# Patient Record
Sex: Male | Born: 1970 | Race: White | Hispanic: No | Marital: Married | Smoking: Current every day smoker
Health system: Southern US, Community
[De-identification: ages and names within clinical notes are randomized; demographics above are authoritative.]

## PROBLEM LIST (undated history)

## (undated) DIAGNOSIS — M549 Dorsalgia, unspecified: Secondary | ICD-10-CM

## (undated) DIAGNOSIS — I10 Essential (primary) hypertension: Secondary | ICD-10-CM

## (undated) HISTORY — PX: KNEE SURGERY: SHX244

---

## 2010-03-15 ENCOUNTER — Emergency Department (HOSPITAL_BASED_OUTPATIENT_CLINIC_OR_DEPARTMENT_OTHER): Admission: EM | Admit: 2010-03-15 | Discharge: 2010-03-15 | Payer: Self-pay | Admitting: Emergency Medicine

## 2010-06-21 ENCOUNTER — Ambulatory Visit: Payer: Self-pay | Admitting: Interventional Radiology

## 2010-06-21 ENCOUNTER — Emergency Department (HOSPITAL_BASED_OUTPATIENT_CLINIC_OR_DEPARTMENT_OTHER): Admission: EM | Admit: 2010-06-21 | Discharge: 2010-06-21 | Payer: Self-pay | Admitting: Emergency Medicine

## 2010-07-05 ENCOUNTER — Ambulatory Visit: Payer: Self-pay | Admitting: Diagnostic Radiology

## 2010-07-05 ENCOUNTER — Ambulatory Visit (HOSPITAL_BASED_OUTPATIENT_CLINIC_OR_DEPARTMENT_OTHER): Admission: RE | Admit: 2010-07-05 | Discharge: 2010-07-05 | Payer: Self-pay | Admitting: Orthopedic Surgery

## 2010-07-06 ENCOUNTER — Ambulatory Visit: Payer: Self-pay | Admitting: Family Medicine

## 2010-07-06 DIAGNOSIS — M545 Low back pain: Secondary | ICD-10-CM

## 2010-07-06 DIAGNOSIS — M542 Cervicalgia: Secondary | ICD-10-CM

## 2010-07-07 ENCOUNTER — Emergency Department (HOSPITAL_BASED_OUTPATIENT_CLINIC_OR_DEPARTMENT_OTHER): Admission: EM | Admit: 2010-07-07 | Discharge: 2010-07-07 | Payer: Self-pay | Admitting: Emergency Medicine
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
## 2010-11-29 LAB — COMPREHENSIVE METABOLIC PANEL
AST: 21 U/L (ref 0–37)
CO2: 28 mEq/L (ref 19–32)
Chloride: 105 mEq/L (ref 96–112)
Creatinine, Ser: 0.9 mg/dL (ref 0.4–1.5)
GFR calc Af Amer: >60 mL/min (ref >60)
GFR calc non Af Amer: >60 mL/min (ref >60)
Glucose, Bld: 110 mg/dL — ABNORMAL HIGH (ref 70–99)
Total Bilirubin: 0.6 mg/dL (ref 0.3–1.2)

## 2010-11-29 LAB — CBC
HCT: 44.9 % (ref 39.0–52.0)
Hemoglobin: 15 g/dL (ref 13.0–17.0)
MCH: 32.7 pg (ref 26.0–34.0)
MCV: 98 fL (ref 78.0–100.0)
RBC: 4.58 MIL/uL (ref 4.22–5.81)

## 2010-11-29 LAB — DIFFERENTIAL
Basophils Absolute: 0.1 10*3/uL (ref 0.0–0.1)
Eosinophils Absolute: 0.2 10*3/uL (ref 0.0–0.7)
Eosinophils Relative: 2 % (ref 0–5)
Lymphocytes Relative: 21 % (ref 12–46)
Neutrophils Relative %: 68 % (ref 43–77)

## 2010-11-29 LAB — PROTIME-INR: Prothrombin Time: 12.1 seconds (ref 11.6–15.2)

## 2015-10-26 ENCOUNTER — Emergency Department (HOSPITAL_BASED_OUTPATIENT_CLINIC_OR_DEPARTMENT_OTHER)
Admission: EM | Admit: 2015-10-26 | Discharge: 2015-10-26 | Disposition: A | Discharge status: Home / Self Care | Payer: PRIVATE HEALTH INSURANCE | Attending: Emergency Medicine | Admitting: Emergency Medicine

## 2015-10-26 ENCOUNTER — Emergency Department (HOSPITAL_BASED_OUTPATIENT_CLINIC_OR_DEPARTMENT_OTHER): Payer: PRIVATE HEALTH INSURANCE

## 2015-10-26 ENCOUNTER — Encounter (HOSPITAL_BASED_OUTPATIENT_CLINIC_OR_DEPARTMENT_OTHER): Payer: Self-pay | Admitting: Emergency Medicine

## 2015-10-26 DIAGNOSIS — Y998 Other external cause status: Secondary | ICD-10-CM | POA: Diagnosis not present

## 2015-10-26 DIAGNOSIS — S20212A Contusion of left front wall of thorax, initial encounter: Secondary | ICD-10-CM | POA: Insufficient documentation

## 2015-10-26 DIAGNOSIS — Y9289 Other specified places as the place of occurrence of the external cause: Secondary | ICD-10-CM | POA: Insufficient documentation

## 2015-10-26 DIAGNOSIS — S29001A Unspecified injury of muscle and tendon of front wall of thorax, initial encounter: Secondary | ICD-10-CM | POA: Diagnosis present

## 2015-10-26 DIAGNOSIS — Y9389 Activity, other specified: Secondary | ICD-10-CM | POA: Insufficient documentation

## 2015-10-26 DIAGNOSIS — F17200 Nicotine dependence, unspecified, uncomplicated: Secondary | ICD-10-CM | POA: Diagnosis not present

## 2015-10-26 DIAGNOSIS — S79912A Unspecified injury of left hip, initial encounter: Secondary | ICD-10-CM | POA: Diagnosis not present

## 2015-10-26 DIAGNOSIS — S50319A Abrasion of unspecified elbow, initial encounter: Secondary | ICD-10-CM | POA: Diagnosis not present

## 2015-10-26 DIAGNOSIS — W11XXXA Fall on and from ladder, initial encounter: Secondary | ICD-10-CM | POA: Insufficient documentation

## 2015-10-26 DIAGNOSIS — T07XXXA Unspecified multiple injuries, initial encounter: Secondary | ICD-10-CM

## 2015-10-26 MED ORDER — METHOCARBAMOL 500 MG PO TABS: 500.0000 mg | ORAL_TABLET | Freq: Two times a day (BID) | ORAL | Status: DC

## 2015-10-26 MED ORDER — NAPROXEN 500 MG PO TABS: 500.0000 mg | ORAL_TABLET | Freq: Two times a day (BID) | ORAL | Status: DC

## 2015-10-26 MED ORDER — HYDROCODONE-ACETAMINOPHEN 5-325 MG PO TABS: 2.0000 | ORAL_TABLET | ORAL | Status: DC | PRN

## 2015-10-26 MED FILL — METHOCARBAMOL 500 MG TABLET: 500 | 10 days supply | Qty: 20 | Fill #0

## 2015-10-26 MED FILL — HYDROCODON-APAP 5-325: 5-325 | 2 days supply | Qty: 20 | Fill #0

## 2015-10-26 NOTE — ED Notes (Signed)
Patient returns from X ray.

## 2015-10-26 NOTE — ED Provider Notes (Signed)
CSN: [id number]     Arrival date & time 10/26/15  0907 History   First MD Initiated Contact with Patient 10/26/15 [phone]     Chief Complaint  Patient presents with  . Fall      HPI  Vision presents for evaluation after a fall from a ladder yesterday. He states he was up on a approximate 10 foot ladder. The bottom of the ladder went out. He fell directly the ground. States he did fall against part of the ladder. Has abrasion to his elbow. Pain left ribs and left hip. Didn't strike his head. No midline neck or back pain. No loss conscious. No numbness weakness tingling extremity. Walk because of the hip pain. No cough or hemoptysis. Is not short of breath. Painful with left ribs with breathing and cough  No past medical history on file. Past Surgical History  Procedure Laterality Date  . Knee surgery     No family history on file. Social History  Substance Use Topics  . Smoking status: Current Every Day Smoker  . Smokeless tobacco: None  . Alcohol Use: No    Review of Systems  Constitutional: Negative for fever, chills, diaphoresis, appetite change and fatigue.  HENT: Negative for mouth sores, sore throat and trouble swallowing.   Eyes: Negative for visual disturbance.  Respiratory: Negative for cough, chest tightness, shortness of breath and wheezing.   Cardiovascular: Positive for chest pain.  Gastrointestinal: Negative for nausea, vomiting, abdominal pain, diarrhea and abdominal distention.  Endocrine: Negative for polydipsia, polyphagia and polyuria.  Genitourinary: Negative for dysuria, frequency and hematuria.  Musculoskeletal: Positive for myalgias and arthralgias. Negative for gait problem.  Skin: Negative for color change, pallor and rash.  Neurological: Negative for dizziness, syncope, light-headedness and headaches.  Hematological: Does not bruise/bleed easily.  Psychiatric/Behavioral: Negative for behavioral problems and confusion.      Allergies  Review of  patient's allergies indicates no known allergies.  Home Medications   Prior to Admission medications   Medication Sig Start Date End Date Taking? Authorizing Provider  acetaminophen (TYLENOL) 325 MG tablet Take 650 mg by mouth every 6 (six) hours as needed.   Yes Historical Provider, MD  ibuprofen (ADVIL,MOTRIN) 200 MG tablet Take 200 mg by mouth every 6 (six) hours as needed.   Yes Historical Provider, MD  HYDROcodone-acetaminophen (NORCO/VICODIN) 5-325 MG tablet Take 2 tablets by mouth every 4 (four) hours as needed. 10/26/15   Rolland Porter, MD  methocarbamol (ROBAXIN) 500 MG tablet Take 1 tablet (500 mg total) by mouth 2 (two) times daily. 10/26/15   Rolland Porter, MD  naproxen (NAPROSYN) 500 MG tablet Take 1 tablet (500 mg total) by mouth 2 (two) times daily. 10/26/15   Rolland Porter, MD   BP 185/108 mmHg  Pulse 104  Temp(Src) 98.2 F (36.8 C) (Oral)  Resp 18  Ht 6' (1.829 m)  Wt 150 lb (68.04 kg)  BMI 20.34 kg/m2  SpO2 97% Physical Exam  Constitutional: He is oriented to person, place, and time. He appears well-developed and well-nourished. No distress.  HENT:  Head: Normocephalic.  Eyes: Conjunctivae are normal. Pupils are equal, round, and reactive to light. No scleral icterus.  Neck: Normal range of motion. Neck supple. No thyromegaly present.  Cardiovascular: Normal rate and regular rhythm.  Exam reveals no gallop and no friction rub.   No murmur heard. Pulmonary/Chest: Effort normal and breath sounds normal. No respiratory distress. He has no wheezes. He has no rales.      Abdominal:  Soft. Bowel sounds are normal. He exhibits no distension. There is no tenderness. There is no rebound.  Musculoskeletal: Normal range of motion.       Legs: Neurological: He is alert and oriented to person, place, and time.  Skin: Skin is warm and dry. No rash noted.  Psychiatric: He has a normal mood and affect. His behavior is normal.    ED Course  Procedures (including critical care time) Labs  Review Labs Reviewed - No data to display  Imaging Review Dg Ribs Unilateral W/chest Left  10/26/2015  CLINICAL DATA:  Left lower rib pain after falling 10 feet from a ladder yesterday. EXAM: LEFT RIBS AND CHEST - 3+ VIEW COMPARISON:  None. FINDINGS: No acute left-sided rib fractures are seen. The heart size and mediastinal contours are normal. The lungs are clear aside from a small calcified left upper lobe granuloma. There is no evidence of pleural effusion or pneumothorax. Moderate thoracolumbar scoliosis noted. IMPRESSION: No evidence of acute left-sided rib fracture, pleural effusion or pneumothorax. Electronically Signed   By: Carey Bullocks M.D.   On: 10/26/2015 10:58   Dg Hip Unilat With Pelvis 2-3 Views Left  10/26/2015  CLINICAL DATA:  Pain following fall from ladder EXAM: DG HIP (WITH OR WITHOUT PELVIS) 2-3V LEFT COMPARISON:  None. FINDINGS: Frontal pelvis as well as frontal and lateral left hip images were obtained. There is no demonstrable fracture or dislocation. Hip joints and sacroiliac joints appear symmetric and unremarkable. There is degenerative change in the visualized lower lumbar spine. No erosive change. IMPRESSION: Degenerative change in visualized lower lumbar spine. No acute fracture or dislocation. The hip joints and sacroiliac joints appear symmetric bilaterally. Electronically Signed   By: Bretta Bang III M.D.   On: 10/26/2015 10:57   I have personally reviewed and evaluated these images and lab results as part of my medical decision-making.   EKG Interpretation None      MDM   Final diagnoses:  Multiple contusions  Chest wall contusion, left, initial encounter    Plan x-rays. We'll reevaluate. Pain control. Patient is driving. We'll have to prescribe the patient his medications after discharge.  11:26:  X-rays. Patient appropriate for discharge home. He is up and dressed. Plan is home. Vicodin, Robaxin, Motrin. Pulmonary toilet, cough and deep  breathing.    Rolland Porter, MD 10/26/15 1126

## 2015-10-26 NOTE — ED Notes (Signed)
Pt states he fell off a ladder yesterday, 10 foot ladder onto floor.  Pt states he is having pain to left side of body.

## 2015-10-26 NOTE — ED Notes (Signed)
Patient transported to X-ray 

## 2015-10-26 NOTE — Discharge Instructions (Signed)
Chest Contusion °A contusion is a deep bruise. Bruises happen when an injury causes bleeding under the skin. Signs of bruising include pain, puffiness (swelling), and discolored skin. The bruise may turn blue, purple, or yellow.  °HOME CARE °· Put ice on the injured area. °¨ Put ice in a plastic bag. °¨ Place a towel between the skin and the bag. °¨ Leave the ice on for 15-20 minutes at a time, 03-04 times a day for the first 48 hours. °· Only take medicine as told by your doctor. °· Rest. °· Take deep breaths (deep-breathing exercises) as told by your doctor. °· Stop smoking if you smoke. °· Do not lift objects over 5 pounds (2.3 kilograms) for 3 days or longer if told by your doctor. °GET HELP RIGHT AWAY IF:  °· You have more bruising or puffiness. °· You have pain that gets worse. °· You have trouble breathing. °· You are dizzy, weak, or pass out (faint). °· You have blood in your pee (urine) or poop (stool). °· You cough up or throw up (vomit) blood. °· Your puffiness or pain is not helped with medicines. °MAKE SURE YOU:  °· Understand these instructions. °· Will watch your condition. °· Will get help right away if you are not doing well or get worse. °  °This information is not intended to replace advice given to you by your health care provider. Make sure you discuss any questions you have with your health care provider. °  °Document Released: 02/20/2008 Document Revised: 05/28/2012 Document Reviewed: 02/25/2012 °Elsevier Interactive Patient Education ©2016 [employer] ° °Contusion °A contusion is a deep bruise. Contusions happen when an injury causes bleeding under the skin. Symptoms of bruising include pain, swelling, and discolored skin. The skin may turn blue, purple, or yellow. °HOME CARE  °· Rest the injured area. °· If told, put ice on the injured area. °¨ Put ice in a plastic bag. °¨ Place a towel between your skin and the bag. °¨ Leave the ice on for 20 minutes, 2-3 times per day. °· If told, put  light pressure (compression) on the injured area using an elastic bandage. Make sure the bandage is not too tight. Remove it and put it back on as told by your doctor. °· If possible, raise (elevate) the injured area above the level of your heart while you are sitting or lying down. °· Take over-the-counter and prescription medicines only as told by your doctor. °GET HELP IF: °· Your symptoms do not get better after several days of treatment. °· Your symptoms get worse. °· You have trouble moving the injured area. °GET HELP RIGHT AWAY IF:  °· You have very bad pain. °· You have a loss of feeling (numbness) in a hand or foot. °· Your hand or foot turns pale or cold. °  °This information is not intended to replace advice given to you by your health care provider. Make sure you discuss any questions you have with your health care provider. °  °Document Released: 02/20/2008 Document Revised: 05/25/2015 Document Reviewed: 01/19/2015 °Elsevier Interactive Patient Education ©2016 [employer] ° °

## 2016-09-03 ENCOUNTER — Encounter (HOSPITAL_BASED_OUTPATIENT_CLINIC_OR_DEPARTMENT_OTHER): Payer: Self-pay | Admitting: Emergency Medicine

## 2016-09-03 ENCOUNTER — Emergency Department (HOSPITAL_BASED_OUTPATIENT_CLINIC_OR_DEPARTMENT_OTHER)
Admission: EM | Admit: 2016-09-03 | Discharge: 2016-09-03 | Disposition: A | Discharge status: Home / Self Care | Payer: PRIVATE HEALTH INSURANCE | Attending: Emergency Medicine | Admitting: Emergency Medicine

## 2016-09-03 ENCOUNTER — Emergency Department (HOSPITAL_BASED_OUTPATIENT_CLINIC_OR_DEPARTMENT_OTHER): Payer: PRIVATE HEALTH INSURANCE

## 2016-09-03 DIAGNOSIS — Z79899 Other long term (current) drug therapy: Secondary | ICD-10-CM | POA: Insufficient documentation

## 2016-09-03 DIAGNOSIS — I10 Essential (primary) hypertension: Secondary | ICD-10-CM | POA: Insufficient documentation

## 2016-09-03 DIAGNOSIS — F17200 Nicotine dependence, unspecified, uncomplicated: Secondary | ICD-10-CM | POA: Diagnosis not present

## 2016-09-03 DIAGNOSIS — M545 Low back pain: Secondary | ICD-10-CM | POA: Diagnosis present

## 2016-09-03 DIAGNOSIS — M5441 Lumbago with sciatica, right side: Secondary | ICD-10-CM | POA: Insufficient documentation

## 2016-09-03 DIAGNOSIS — G8929 Other chronic pain: Secondary | ICD-10-CM | POA: Insufficient documentation

## 2016-09-03 DIAGNOSIS — M5442 Lumbago with sciatica, left side: Secondary | ICD-10-CM | POA: Diagnosis not present

## 2016-09-03 HISTORY — DX: Essential (primary) hypertension: I10

## 2016-09-03 LAB — CBC WITH DIFFERENTIAL/PLATELET
Basophils Absolute: 0 10*3/uL (ref 0.0–0.1)
Basophils Relative: 0 %
Eosinophils Absolute: 0.2 10*3/uL (ref 0.0–0.7)
Eosinophils Relative: 2 %
HCT: 39.7 % (ref 39.0–52.0)
Hemoglobin: 13.2 g/dL (ref 13.0–17.0)
Lymphocytes Relative: 15 %
Lymphs Abs: 1.4 10*3/uL (ref 0.7–4.0)
MCH: 32.2 pg (ref 26.0–34.0)
MCHC: 33.2 g/dL (ref 30.0–36.0)
MCV: 96.8 fL (ref 78.0–100.0)
Monocytes Absolute: 0.9 10*3/uL (ref 0.1–1.0)
Monocytes Relative: 9 %
Neutro Abs: 7 10*3/uL (ref 1.7–7.7)
Neutrophils Relative %: 74 %
Platelets: 181 10*3/uL (ref 150–400)
RBC: 4.1 MIL/uL — ABNORMAL LOW (ref 4.22–5.81)
RDW: 13.6 % (ref 11.5–15.5)
WBC: 9.6 10*3/uL (ref 4.0–10.5)

## 2016-09-03 LAB — BASIC METABOLIC PANEL
Anion gap: 7 (ref 5–15)
BUN: 11 mg/dL (ref 6–20)
CO2: 24 mmol/L (ref 22–32)
Calcium: 9 mg/dL (ref 8.9–10.3)
Chloride: 110 mmol/L (ref 101–111)
Creatinine, Ser: 0.62 mg/dL (ref 0.61–1.24)
GFR calc Af Amer: >60 mL/min (ref >60)
GFR calc non Af Amer: >60 mL/min (ref >60)
Glucose, Bld: 103 mg/dL — ABNORMAL HIGH (ref 65–99)
Potassium: 4.2 mmol/L (ref 3.5–5.1)
Sodium: 141 mmol/L (ref 135–145)

## 2016-09-03 LAB — URINALYSIS, ROUTINE W REFLEX MICROSCOPIC
Bilirubin Urine: NEGATIVE
Glucose, UA: NEGATIVE mg/dL
Hgb urine dipstick: NEGATIVE
Ketones, ur: NEGATIVE mg/dL
Leukocytes, UA: NEGATIVE
Nitrite: NEGATIVE
Protein, ur: NEGATIVE mg/dL
Specific Gravity, Urine: 1.003 — ABNORMAL LOW (ref 1.005–1.030)
pH: 6.5 (ref 5.0–8.0)

## 2016-09-03 MED ORDER — HYDROCODONE-ACETAMINOPHEN 5-325 MG PO TABS
1.0000 | ORAL_TABLET | Freq: Once | ORAL | Status: AC
  Administered 2016-09-03: 1 via ORAL
  Filled 2016-09-03: qty 1

## 2016-09-03 MED ORDER — HYDROCODONE-ACETAMINOPHEN 5-325 MG PO TABS: 1.0000 | ORAL_TABLET | Freq: Four times a day (QID) | ORAL | 0 refills | Status: DC | PRN

## 2016-09-03 MED ORDER — CYCLOBENZAPRINE HCL 10 MG PO TABS: 10.0000 mg | ORAL_TABLET | Freq: Every day | ORAL | 0 refills | Status: DC

## 2016-09-03 MED ORDER — PREDNISONE 50 MG PO TABS: 50.0000 mg | ORAL_TABLET | Freq: Every day | ORAL | 0 refills | Status: DC

## 2016-09-03 MED FILL — CYCLOBENZAPRINE 10 MG TAB: 10 | 15 days supply | Qty: 15 | Fill #0

## 2016-09-03 MED FILL — predniSONE 50 MG TABS: 50 | 5 days supply | Qty: 5 | Fill #0

## 2016-09-03 MED FILL — HYDROCODON-APAP 5-325: 5-325 | 4 days supply | Qty: 15 | Fill #0

## 2016-09-03 NOTE — ED Notes (Signed)
Pt was notified he will need a driver. States he does not have anyone to call. States he will stay until 2pm before leaving. Emergency planning/management officerolice officer and pharmacy notified that pt is not to leave and drive or pick up Rx until after 2pm.

## 2016-09-03 NOTE — ED Triage Notes (Signed)
States  Generalized pain for years.  Hasn't slept for 4 days

## 2016-09-03 NOTE — Discharge Instructions (Signed)
Return here as needed. Follow up with the doctor provided. Your testing here today was normal.  °

## 2016-09-04 NOTE — ED Provider Notes (Signed)
[facility]-EMERGENCY DEPT Provider Note   CSN: [id number] Arrival date & time: 09/03/16  [id number]     History   Chief Complaint No chief complaint on file.   HPI Jesse Love is a 45 y.o. male.  HPI Patient presents to the emergency department with back pain that radiates into his lower extremities.  Patient states is been ongoing for quite a while, but states has been worse over the last week.  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  He states that he worked.  He stepped jobs over the last 30 years.  He states that the pain is worse in the evenings after he finishes working.  The patient states nothing seems make the condition better, movement and palpation make the pain worse.  Patient states that he takes Tylenol and ibuprofen with no reliefThe patient denies chest pain, shortness of breath, headache,blurred vision, neck pain, fever,  weakness, numbness, dizziness, anorexia, edema, abdominal pain, nausea, vomiting, diarrhea, rash, dysuria, hematemesis, bloody stool, near syncope, or syncope. Past Medical History:  Diagnosis Date  . Hypertension     Patient Active Problem List   Diagnosis Date Noted  . NECK PAIN 07/06/2010  . BACK PAIN, LUMBAR 07/06/2010    Past Surgical History:  Procedure Laterality Date  . KNEE SURGERY         Home Medications    Prior to Admission medications   Medication Sig Start Date End Date Taking? Authorizing Provider  acetaminophen (TYLENOL) 325 MG tablet Take 650 mg by mouth every 6 (six) hours as needed.    Historical Provider, MD  cyclobenzaprine (FLEXERIL) 10 MG tablet Take 1 tablet (10 mg total) by mouth at bedtime. 09/03/16   Charlestine Nighthristopher Lenin Kuhnle, PA-C  HYDROcodone-acetaminophen (NORCO/VICODIN) 5-325 MG tablet Take 1 tablet by mouth every 6 (six) hours as needed for moderate pain. 09/03/16   Charlestine Nighthristopher Mollee Neer, PA-C  ibuprofen (ADVIL,MOTRIN) 200 MG tablet Take 200 mg by mouth every 6 (six) hours as  needed.    Historical Provider, MD  predniSONE (DELTASONE) 50 MG tablet Take 1 tablet (50 mg total) by mouth daily with breakfast. 09/03/16   Charlestine Nighthristopher Maleeyah Mccaughey, PA-C    Family History No family history on file.  Social History Social History  Substance Use Topics  . Smoking status: Current Every Day Smoker  . Smokeless tobacco: Not on file  . Alcohol use No     Allergies   Nsaids   Review of Systems Review of Systems All other systems negative except as documented in the HPI. All pertinent positives and negatives as reviewed in the HPI. Physical Exam Updated Vital Signs BP 168/99 (BP Location: Right Arm)   Pulse 65   Temp 97.8 F (36.6 C) (Oral)   Resp 16   Ht 6' (1.829 m)   Wt 75.8 kg   SpO2 99%   BMI 22.65 kg/m   Physical Exam  Constitutional: He is oriented to person, place, and time. He appears well-developed and well-nourished. No distress.  HENT:  Head: Normocephalic and atraumatic.  Mouth/Throat: Oropharynx is clear and moist.  Eyes: Pupils are equal, round, and reactive to light.  Neck: Normal range of motion. Neck supple.  Cardiovascular: Normal rate, regular rhythm and normal heart sounds.  Exam reveals no gallop and no friction rub.   No murmur heard. Pulmonary/Chest: Effort normal and breath sounds normal. No respiratory distress. He has no wheezes.  Abdominal: Soft. Bowel sounds are normal. He exhibits no distension.  There is no tenderness.  Musculoskeletal:       Lumbar back: He exhibits tenderness and pain. He exhibits normal range of motion, no bony tenderness and no spasm.  Neurological: He is alert and oriented to person, place, and time. He exhibits normal muscle tone. Coordination normal.  Skin: Skin is warm and dry. No rash noted. No erythema.  Psychiatric: He has a normal mood and affect. His behavior is normal.  Nursing note and vitals reviewed.    ED Treatments / Results  Labs (all labs ordered are listed, but only abnormal results  are displayed) Labs Reviewed  BASIC METABOLIC PANEL - Abnormal; Notable for the following:       Result Value   Glucose, Bld 103 (*)    All other components within normal limits  CBC WITH DIFFERENTIAL/PLATELET - Abnormal; Notable for the following:    RBC 4.10 (*)    All other components within normal limits  URINALYSIS, ROUTINE W REFLEX MICROSCOPIC - Abnormal; Notable for the following:    Specific Gravity, Urine 1.003 (*)    All other components within normal limits    EKG  EKG Interpretation None       Radiology Dg Chest 2 View  Result Date: 09/03/2016 CLINICAL DATA:  45 year old male with generalized pain and insomnia EXAM: CHEST  2 VIEW COMPARISON:  Prior chest x-ray and rib series 2 05/06/2016 FINDINGS: The lungs are clear and negative for focal airspace consolidation, pulmonary edema or suspicious pulmonary nodule. Stable small calcified granuloma in the periphery of the left upper lobe. No pleural effusion or pneumothorax. Cardiac and mediastinal contours are within normal limits. No acute fracture or lytic or blastic osseous lesions. The visualized upper abdominal bowel gas pattern is unremarkable. IMPRESSION: No active cardiopulmonary disease. Electronically Signed   By: Malachy MoanHeath  McCullough M.D.   On: 09/03/2016 09:47    Procedures Procedures (including critical care time)  Medications Ordered in ED Medications  HYDROcodone-acetaminophen (NORCO/VICODIN) 5-325 MG per tablet 1 tablet (1 tablet Oral Given 09/03/16 1007)     Initial Impression / Assessment and Plan / ED Course  I have reviewed the triage vital signs and the nursing notes.  Pertinent labs & imaging results that were available during my care of the patient were reviewed by me and considered in my medical decision making (see chart for details).  Clinical Course     Patient will be referred for his sciatica.  Patient states that he has never seen anyone for these symptoms.  I advised him that follow-up  was very important.  Patient has no neurological deficits noted on exam.  Told to return here as needed  Final Clinical Impressions(s) / ED Diagnoses   Final diagnoses:  Chronic bilateral low back pain with bilateral sciatica    New Prescriptions Discharge Medication List as of 09/03/2016 11:49 AM    START taking these medications   Details  cyclobenzaprine (FLEXERIL) 10 MG tablet Take 1 tablet (10 mg total) by mouth at bedtime., Starting Mon 09/03/2016, Print    HYDROcodone-acetaminophen (NORCO/VICODIN) 5-325 MG tablet Take 1 tablet by mouth every 6 (six) hours as needed for moderate pain., Starting Mon 09/03/2016, Print    predniSONE (DELTASONE) 50 MG tablet Take 1 tablet (50 mg total) by mouth daily with breakfast., Starting Mon 09/03/2016, Print         Charlestine NightChristopher Athene Schuhmacher, PA-C 09/04/16 1710    Heide Scaleshristopher J Tegeler, MD 09/06/16 [phone]

## 2017-04-05 ENCOUNTER — Encounter (HOSPITAL_BASED_OUTPATIENT_CLINIC_OR_DEPARTMENT_OTHER): Payer: Self-pay | Admitting: Emergency Medicine

## 2017-04-05 ENCOUNTER — Emergency Department (HOSPITAL_BASED_OUTPATIENT_CLINIC_OR_DEPARTMENT_OTHER)
Admission: EM | Admit: 2017-04-05 | Discharge: 2017-04-05 | Disposition: A | Discharge status: Home / Self Care | Payer: PRIVATE HEALTH INSURANCE | Attending: Emergency Medicine | Admitting: Emergency Medicine

## 2017-04-05 ENCOUNTER — Emergency Department (HOSPITAL_BASED_OUTPATIENT_CLINIC_OR_DEPARTMENT_OTHER): Payer: PRIVATE HEALTH INSURANCE

## 2017-04-05 DIAGNOSIS — Y929 Unspecified place or not applicable: Secondary | ICD-10-CM | POA: Diagnosis not present

## 2017-04-05 DIAGNOSIS — Y9301 Activity, walking, marching and hiking: Secondary | ICD-10-CM | POA: Insufficient documentation

## 2017-04-05 DIAGNOSIS — S83412A Sprain of medial collateral ligament of left knee, initial encounter: Secondary | ICD-10-CM | POA: Diagnosis not present

## 2017-04-05 DIAGNOSIS — F17200 Nicotine dependence, unspecified, uncomplicated: Secondary | ICD-10-CM | POA: Diagnosis not present

## 2017-04-05 DIAGNOSIS — Y999 Unspecified external cause status: Secondary | ICD-10-CM | POA: Diagnosis not present

## 2017-04-05 DIAGNOSIS — Z79899 Other long term (current) drug therapy: Secondary | ICD-10-CM | POA: Insufficient documentation

## 2017-04-05 DIAGNOSIS — W541XXA Struck by dog, initial encounter: Secondary | ICD-10-CM | POA: Diagnosis not present

## 2017-04-05 DIAGNOSIS — I10 Essential (primary) hypertension: Secondary | ICD-10-CM | POA: Diagnosis not present

## 2017-04-05 DIAGNOSIS — M545 Low back pain: Secondary | ICD-10-CM | POA: Insufficient documentation

## 2017-04-05 DIAGNOSIS — S8992XA Unspecified injury of left lower leg, initial encounter: Secondary | ICD-10-CM | POA: Diagnosis present

## 2017-04-05 HISTORY — DX: Dorsalgia, unspecified: M54.9

## 2017-04-05 MED ORDER — HYDROCODONE-ACETAMINOPHEN 5-325 MG PO TABS: ORAL_TABLET | ORAL | 0 refills | Status: AC

## 2017-04-05 MED FILL — HYDROCODON-APAP 5-325: 5-325 | 1 days supply | Qty: 8 | Fill #0

## 2017-04-05 NOTE — Discharge Instructions (Signed)
Please read and follow all provided instructions.  Your diagnoses today include:  1. Sprain of medial collateral ligament of right knee, initial encounter     Tests performed today include:  An x-ray of the affected area - does NOT show any broken bones  Vital signs. See below for your results today.   Medications prescribed:   Vicodin (hydrocodone/acetaminophen) - narcotic pain medication  DO NOT drive or perform any activities that require you to be awake and alert because this medicine can make you drowsy. BE VERY CAREFUL not to take multiple medicines containing Tylenol (also called acetaminophen). Doing so can lead to an overdose which can damage your liver and cause liver failure and possibly death.   Ibuprofen (Motrin, Advil) - anti-inflammatory pain medication  Do not exceed 600mg  ibuprofen every 6 hours, take with food  You have been prescribed an anti-inflammatory medication or NSAID. Take with food. Take smallest effective dose for the shortest duration needed for your pain. Stop taking if you experience stomach pain or vomiting.   Take any prescribed medications only as directed.  Home care instructions:   Follow any educational materials contained in this packet  Follow R.I.C.E. Protocol:  R - rest your injury   I  - use ice on injury without applying directly to skin  C - compress injury with bandage or splint  E - elevate the injury as much as possible  Follow-up instructions: Please follow-up with your primary care provider or the provided orthopedic physician (bone specialist) for recheck in 1 week.   Return instructions:   Please return if your toes or feet are numb or tingling, appear gray or blue, or you have severe pain (also elevate the leg and loosen splint or wrap if you were given one)  Please return to the Emergency Department if you experience worsening symptoms.   Please return if you have any other emergent concerns.  Additional  Information:  Your vital signs today were: BP 131/90 (BP Location: Left Arm)    Pulse 74    Temp 98.7 F (37.1 C) (Oral)    Resp 18    Ht 6' (1.829 m)    Wt 76.2 kg (168 lb)    SpO2 98%    BMI 22.78 kg/m  If your blood pressure (BP) was elevated above 135/85 this visit, please have this repeated by your doctor within one month. --------------
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
## 2017-04-05 NOTE — ED Notes (Signed)
Pt helped into wheelchair. 

## 2017-04-05 NOTE — ED Triage Notes (Signed)
Injured left knee yesterday while walking dog. Also c/o of chronic back pain since 1994

## 2019-04-02 IMAGING — DX DG KNEE COMPLETE 4+V*L*
4 series · 4 of 4 positions shown · non-contrast
Comparison: 06/21/2010

CLINICAL DATA: Dog ran into pt's knee last night and knocked him
down,pain and swelling everywhere

EXAM:
LEFT KNEE - COMPLETE 4+ VIEW

[knee ap]
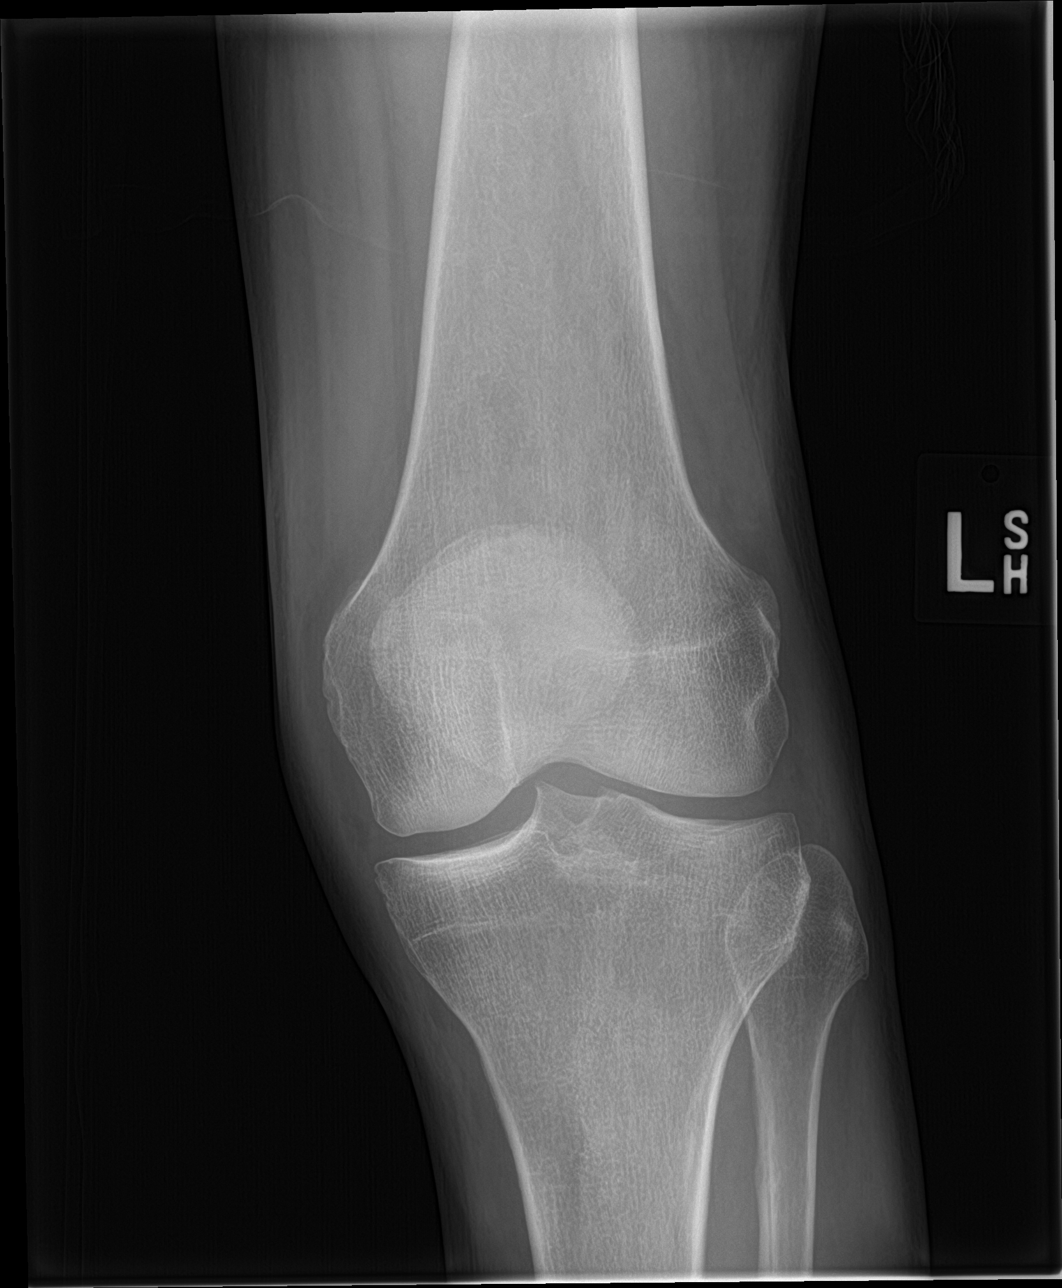

[knee lat]
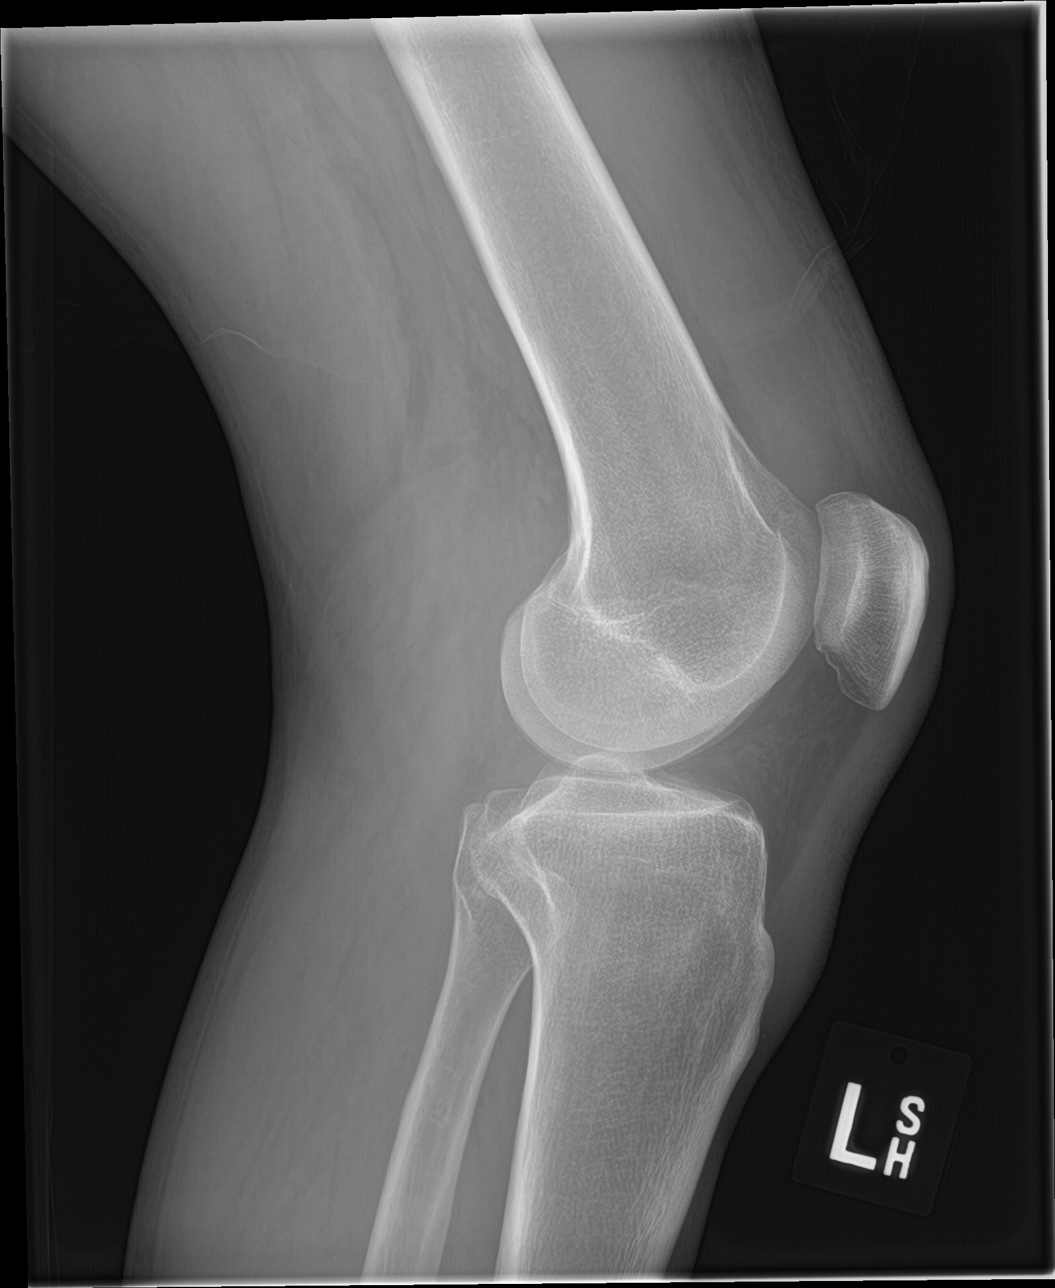

[knee obl (1 of 2)]
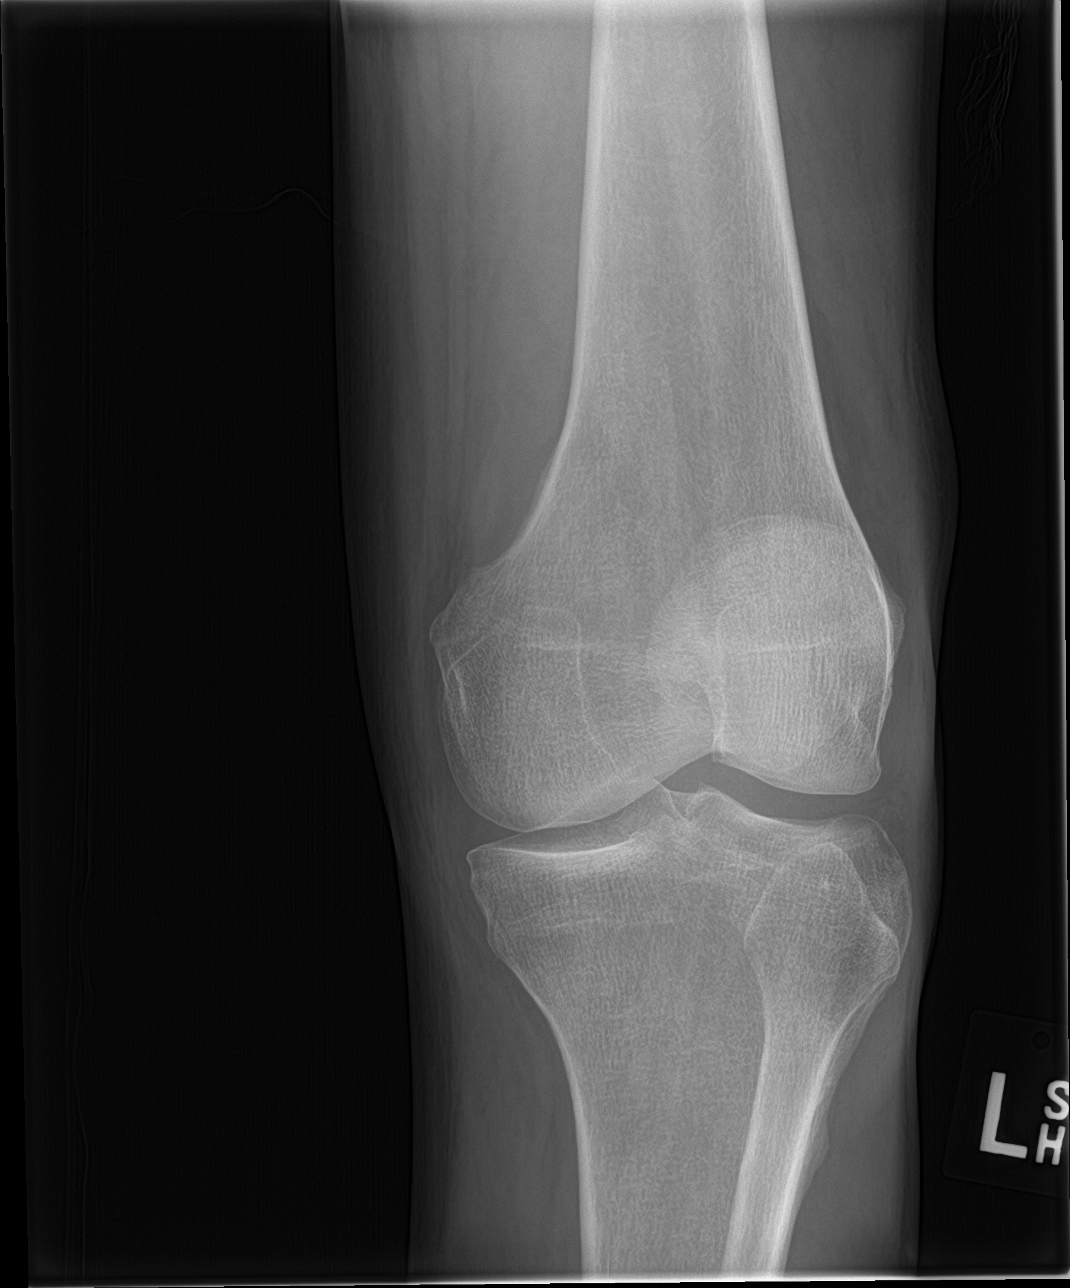

[knee obl (2 of 2)]
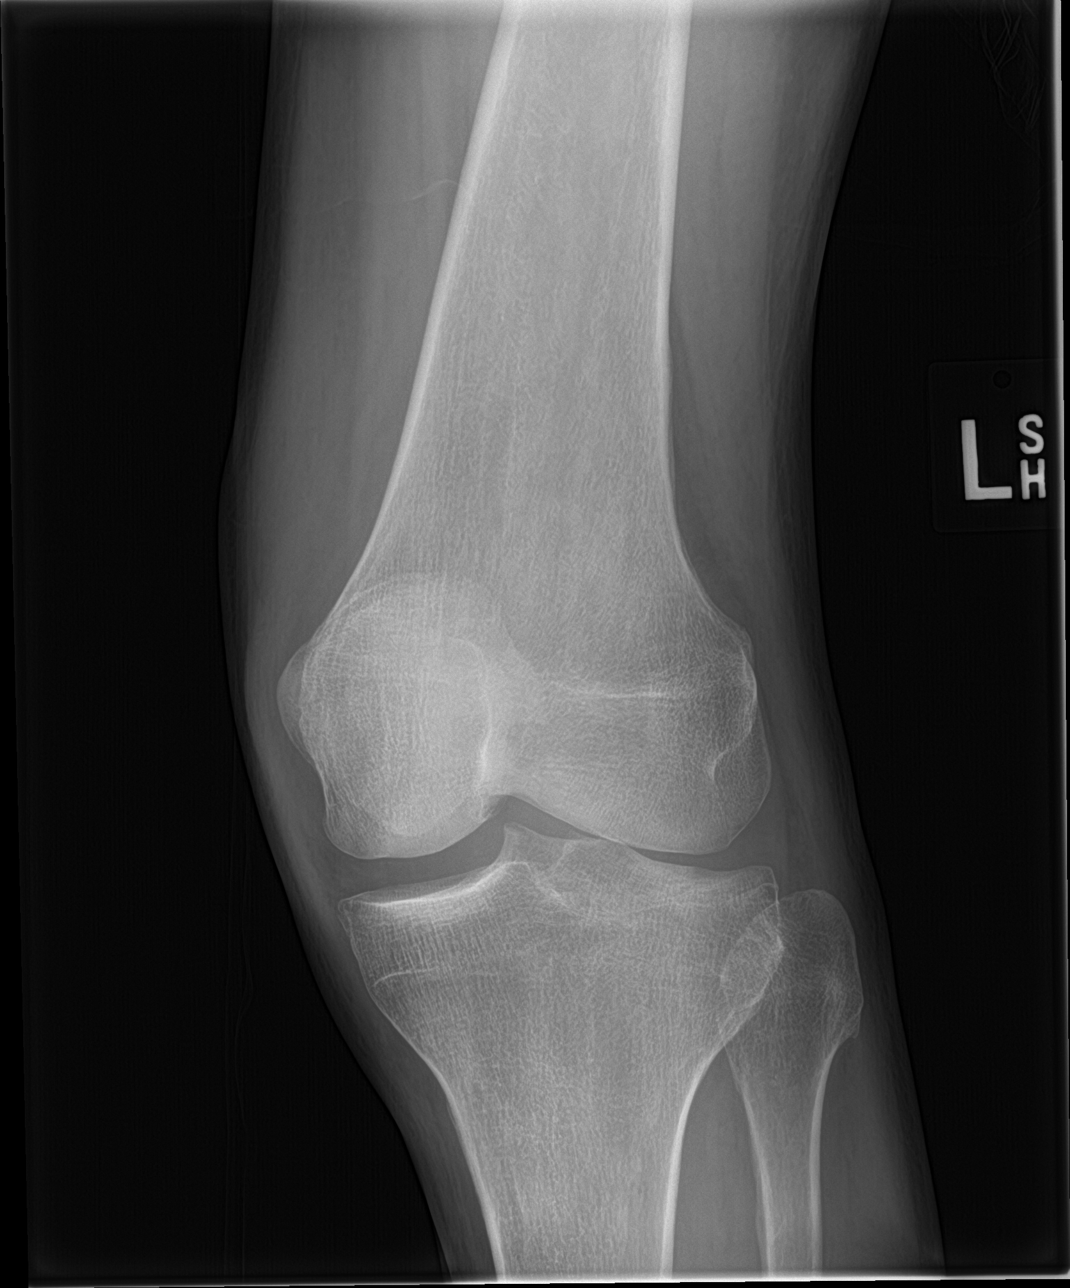

[4 of 4 positions shown; findings below may reference images not displayed]

FINDINGS: No fracture or dislocation. Mild degenerative change involving the
medial compartment of the knee with joint space loss, articular
surface irregularity and subchondral sclerosis. There is minimal
spurring the tibial spines. No evidence of chondrocalcinosis.
Suspected small knee joint effusion. Regional soft tissues appear
normal. No radiopaque foreign body.
IMPRESSION: 1. Small knee joint effusion.  Otherwise, no acute findings.
2. Mild degenerative change involving the medial compartment of the
knee.
